# Patient Record
Sex: Male | Born: 2005 | Race: White | Hispanic: Yes | Marital: Single | Smoking: Never smoker
Health system: Southern US, Community
[De-identification: ages and names within clinical notes are randomized; demographics above are authoritative.]

## PROBLEM LIST (undated history)

## (undated) DIAGNOSIS — H539 Unspecified visual disturbance: Secondary | ICD-10-CM

## (undated) DIAGNOSIS — H1010 Acute atopic conjunctivitis, unspecified eye: Secondary | ICD-10-CM

## (undated) DIAGNOSIS — F909 Attention-deficit hyperactivity disorder, unspecified type: Secondary | ICD-10-CM

## (undated) DIAGNOSIS — L309 Dermatitis, unspecified: Secondary | ICD-10-CM

## (undated) DIAGNOSIS — H547 Unspecified visual loss: Secondary | ICD-10-CM

## (undated) HISTORY — DX: Attention-deficit hyperactivity disorder, unspecified type: F90.9

## (undated) HISTORY — DX: Unspecified visual loss: H54.7

## (undated) HISTORY — DX: Dermatitis, unspecified: L30.9

## (undated) HISTORY — DX: Unspecified visual disturbance: H53.9

## (undated) HISTORY — DX: Acute atopic conjunctivitis, unspecified eye: H10.10

---
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
## 2024-01-31 ENCOUNTER — Ambulatory Visit: Payer: Self-pay | Admitting: Pediatrics

## 2024-02-14 ENCOUNTER — Ambulatory Visit (INDEPENDENT_AMBULATORY_CARE_PROVIDER_SITE_OTHER): Admitting: Pediatrics

## 2024-02-14 ENCOUNTER — Encounter: Payer: Self-pay | Admitting: Pediatrics

## 2024-02-14 VITALS — BP 114/62 | Ht 68.5 in | Wt 143.8 lb

## 2024-02-14 DIAGNOSIS — Z131 Encounter for screening for diabetes mellitus: Secondary | ICD-10-CM | POA: Diagnosis not present

## 2024-02-14 DIAGNOSIS — R7303 Prediabetes: Secondary | ICD-10-CM

## 2024-02-14 LAB — POCT GLYCOSYLATED HEMOGLOBIN (HGB A1C): Hemoglobin A1C: 5.7 % — AB (ref 4.0–5.6)

## 2024-02-14 NOTE — Progress Notes (Signed)
  Subjective:    Jermaine Cobb is a 18 y.o. old male here  for Follow-up (Pre- diabetes, Nail concern (Left pointing finger)) .    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  Endocrine: Negative for polydipsia, polyphagia and polyuria.       Objective:    BP 114/62 (BP Location: Left Arm, Patient Position: Sitting, Cuff Size: Normal)   Ht 5' 8.5" (1.74 m)   Wt 143 lb 12.8 oz (65.2 kg)   BMI 21.54 kg/m  Physical Exam Constitutional:      Appearance: Normal appearance.  Cardiovascular:     Rate and Rhythm: Normal rate and regular rhythm.  Pulmonary:     Effort: Pulmonary effort is normal.     Breath sounds: Normal breath sounds.  Abdominal:     Palpations: Abdomen is soft.  Neurological:     Mental Status: He is alert.        Assessment and Plan:     Jermaine Cobb was seen today for Follow-up (Pre- diabetes, Nail concern (Left pointing finger)) .   Problem List Items Addressed This Visit   None Visit Diagnoses       Pre-diabetes    -  Primary     Screening for diabetes mellitus       Relevant Orders   POCT glycosylated hemoglobin (Hb A1C) (Completed)       H/o elevated hgb A1C - down almost into normal range just will dietary changes/decresaing soda. Congratulated on positive changes made.  Reivewed diet - avoid added sugars. Encourage physical activity.   Will plan to follow up at next PE - can schedule here or transition to adult medicine - options discussed.   No follow-ups on file.  Alvena [facility], MD

## 2024-05-25 ENCOUNTER — Encounter (HOSPITAL_COMMUNITY): Payer: Self-pay

## 2024-05-25 ENCOUNTER — Other Ambulatory Visit: Payer: Self-pay

## 2024-05-25 ENCOUNTER — Emergency Department (HOSPITAL_COMMUNITY)
Admission: EM | Admit: 2024-05-25 | Discharge: 2024-05-26 | Discharge status: Against Medical Advice | Attending: Emergency Medicine | Admitting: Emergency Medicine

## 2024-05-25 DIAGNOSIS — R5383 Other fatigue: Secondary | ICD-10-CM | POA: Diagnosis not present

## 2024-05-25 DIAGNOSIS — Z5321 Procedure and treatment not carried out due to patient leaving prior to being seen by health care provider: Secondary | ICD-10-CM | POA: Diagnosis not present

## 2024-05-25 DIAGNOSIS — R220 Localized swelling, mass and lump, head: Secondary | ICD-10-CM | POA: Insufficient documentation

## 2024-05-25 NOTE — ED Notes (Signed)
No answer when called for room 

## 2024-05-25 NOTE — ED Triage Notes (Signed)
 PT states he has been feeling fatigue for 3 days but is starting to feel better. PT also states he has new bumps forming on the back of his head in the left occipital region.

## 2024-05-31 ENCOUNTER — Encounter (HOSPITAL_COMMUNITY): Payer: Self-pay

## 2024-05-31 ENCOUNTER — Ambulatory Visit (HOSPITAL_COMMUNITY)
Admission: EM | Admit: 2024-05-31 | Discharge: 2024-05-31 | Disposition: A | Discharge status: Home / Self Care | Attending: Nurse Practitioner | Admitting: Nurse Practitioner

## 2024-05-31 ENCOUNTER — Ambulatory Visit

## 2024-05-31 DIAGNOSIS — R5383 Other fatigue: Secondary | ICD-10-CM | POA: Diagnosis not present

## 2024-05-31 DIAGNOSIS — R21 Rash and other nonspecific skin eruption: Secondary | ICD-10-CM | POA: Diagnosis not present

## 2024-05-31 DIAGNOSIS — B349 Viral infection, unspecified: Secondary | ICD-10-CM | POA: Diagnosis not present

## 2024-05-31 LAB — POCT RAPID STREP A (OFFICE): Rapid Strep A Screen: NEGATIVE

## 2024-05-31 LAB — POCT MONO SCREEN (KUC): Mono, POC: NEGATIVE

## 2024-05-31 MED ORDER — TRIAMCINOLONE ACETONIDE 0.1 % EX CREA: 1.0000 | TOPICAL_CREAM | Freq: Two times a day (BID) | CUTANEOUS | 0 refills | Status: AC

## 2024-05-31 NOTE — Discharge Instructions (Signed)
 You were seen today for fever, fatigue, sore throat, headache, and neck discomfort. On your exam, there was some mild redness behind both ears, with a slightly larger area that extended to the left side of your neck. There was no swelling, rash, or warmth to suggest infection. Your rapid strep test and mono test were both negative. A throat culture has been sent for confirmation. Your symptoms appear most consistent with a viral illness, and the skin irritation may be related to your recent perm.  You have been prescribed triamcinolone  cream to apply to the red areas twice a day for up to two weeks. For symptom relief, drink plenty of fluids, get extra rest, and use acetaminophen (Tylenol) or ibuprofen  (Motrin /Advil ) to help with fever, sore throat, or body aches. Lozenges, warm teas with honey, and saltwater gargles may also help with throat discomfort.  Please follow up with your primary care provider within the next week for reassessment.You will only be notified if your culture is positive. Otherwise, review results with your primary care provider or view on your MyChart.   Go to the emergency department immediately if you develop severe or worsening sore throat, difficulty breathing or swallowing, high fever that does not improve with medication, spreading rash, swelling of the face or neck, or any other concerning or rapidly worsening symptoms.

## 2024-05-31 NOTE — ED Triage Notes (Addendum)
 Onset 2 weeks ago with fevers, fatigue, headaches with bumps on the posterior left of the head and neck. Denies any new foods, meds, or products. Denies any known sick exposure. Patient took tylenol and otc cold relief which helped the fever and pains.   Sore throat onset 2 days ago.   States the bumps on the back of the head and neck are painful to touch and with turning his head.

## 2024-05-31 NOTE — ED Provider Notes (Signed)
 [facility]    CSN: [id number] Arrival date & time: 05/31/24  9047      History   Chief Complaint Chief Complaint  Patient presents with   Fever   Rash    HPI Jermaine Cobb is a 18 y.o. male.   Discussed the use of AI scribe software for clinical note transcription with the patient, who gave verbal consent to proceed.   The patient presents with a 2-week history of feeling unwell, characterized by low-grade fever, fatigue, and not feeling normal. The patient's primary concern is the appearance of small bumps on both sides of the neck and along the hairline, which have persisted for at least 2 weeks.  The patient reports experiencing fatigue, particularly after work, which has impacted their ability to engage in activities. Yesterday, they had to sleep instead of going out as planned. The patient describes their fever as pretty high today, causing them to miss work. The patient also complains of a sore throat but denies any cough, runny nose, sneezing, or nasal congestion.  The neck bumps are described as little bumps located on both sides of the neck and along the hairline. The patient notes that these bumps sometimes cause headaches, especially when pressed or touched. The patient had initially waited to see if the bumps would resolve on their own but became concerned when they persisted.  The patient mentions getting a perm about 2 weeks before the onset of symptoms, though they are unsure if this is related to their current condition. He denies using any new soaps, lotions, detergents, hair products, colognes, or sprays. The patient also denies any known insect bites or outdoor exposures that could have contributed to their symptoms.  To manage symptoms, the patient has taken tylenol and motrin , which he reports helped a little. He confirms maintaining adequate hydration throughout their illness.  The following portions of the patient's history were  reviewed and updated as appropriate: allergies, current medications, past family history, past medical history, past social history, past surgical history, and problem list.    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 No pertinent surgical history.     Home Medications    Prior to Admission medications   Not on File    Family History Family History  Problem Relation Age of Onset   Hypertension Father     Social History Social History   Tobacco Use   Smoking status: Never   Smokeless tobacco: Never  Vaping Use   Vaping status: Never Used  Substance Use Topics   Alcohol use: Never   Drug use: Yes    Types: Marijuana     Allergies   Patient has no known allergies.   Review of Systems Review of Systems  Constitutional:  Positive for fatigue and fever. Negative for chills.  HENT:  Positive for sore throat. Negative for congestion, rhinorrhea and sneezing.   Respiratory:  Negative for cough.   Gastrointestinal:  Negative for diarrhea, nausea and vomiting.  Musculoskeletal:  Positive for neck pain. Negative for myalgias and neck stiffness.  Skin:  Positive for rash.  Neurological:  Positive for headaches.  All other systems reviewed and are negative.    Physical Exam Triage Vital Signs ED Triage Vitals  Encounter Vitals Group     BP 05/31/24 1021 110/72     Girls Systolic BP Percentile --      Girls Diastolic BP Percentile --      Boys Systolic BP Percentile --      Boys Diastolic BP Percentile --      Pulse Rate 05/31/24 1021 68     Resp 05/31/24  1021 16     Temp 05/31/24 1021 98.5 F (36.9 C)     Temp Source 05/31/24 1021 Oral     SpO2 05/31/24 1021 98 %     Weight 05/31/24 1017 150 lb (68 kg)     Height 05/31/24 1017 5' 10 (1.778 m)     Head Circumference --      Peak Flow --      Pain Score 05/31/24 1017 8     Pain Loc --      Pain Education --      Exclude from Growth Chart --    No data found.  Updated Vital Signs BP 110/72 (BP Location: Right Arm)   Pulse 68   Temp 98.5 F (36.9 C) (Oral)   Resp 16   Ht 5' 10 (1.778 m)   Wt 150 lb (68 kg)   SpO2 98%   BMI 21.52 kg/m   Visual Acuity Right Eye Distance:   Left Eye Distance:   Bilateral Distance:    Right Eye Near:   Left Eye Near:    Bilateral Near:     Physical Exam Vitals reviewed.  Constitutional:      General: He is not in acute distress.    Appearance: Normal appearance. He is not ill-appearing, toxic-appearing or diaphoretic.  HENT:     Head: Normocephalic.     Right Ear: Tympanic membrane, ear canal and external ear normal.     Left Ear: Tympanic membrane, ear canal and external ear normal.     Nose: Nose normal.     Mouth/Throat:     Mouth: Mucous membranes are moist.     Pharynx: Uvula midline. Pharyngeal swelling and posterior oropharyngeal erythema present. No oropharyngeal exudate or uvula swelling.  Eyes:     Conjunctiva/sclera: Conjunctivae normal.  Neck:     Trachea: Phonation normal.  Cardiovascular:     Rate and Rhythm: Normal rate and regular rhythm.  Pulmonary:     Effort: Pulmonary effort is normal.     Breath sounds: Normal breath sounds.  Musculoskeletal:        General: Normal range of motion.     Cervical back: Normal range of motion and neck supple. No rigidity.  Lymphadenopathy:     Cervical: Cervical adenopathy present.     Right cervical: Posterior cervical adenopathy present.  Skin:    General: Skin is warm and dry.     Comments: Mild erythema observed behind the left ear at the hairline, extending to the  nape of the neck, without visible or palpable rash, swelling, or increased warmth. A smaller area of mild erythema is present behind the right ear, with no extension, rash, swelling, or warmth noted.  Neurological:     General: No focal deficit present.     Mental Status: He is alert and oriented to person, place, and time.      UC Treatments / Results  Labs (all labs ordered are listed, but only abnormal results are displayed) Labs Reviewed  POCT MONO SCREEN [facility])  POCT RAPID STREP A (OFFICE)    EKG   Radiology No results found.  Procedures Procedures (including critical care time)  Medications Ordered in UC Medications - No data to display  Initial Impression / Assessment and Plan / UC Course  I have reviewed the triage vital signs and the nursing notes.  Pertinent labs & imaging results that were available during my care of the patient were reviewed by me and considered in my medical decision making (see chart for details).       The patient presents with a two-week history of subjective fevers and fatigue, with associated sore throat, headache, and neck pain. He also reports concern for a rash on the neck. Examination revealed mild erythema behind both ears, extending to the nape of the neck on the left side, without rash, swelling, or warmth. Findings are most consistent with irritation or reaction related to a recent perm, along with symptoms of a likely viral illness. Rapid strep testing was negative, and a culture has been sent for confirmation. Triamcinolone  cream was prescribed to apply to affected areas twice daily for up to two weeks. Supportive care including hydration, rest, and over-the-counter acetaminophen or ibuprofen  for fever and discomfort was advised. The patient was instructed to follow up with his primary care provider for reassessment and to seek emergency care if he develops worsening fever, difficulty swallowing, shortness of breath, rash progression,  or other concerning symptoms.  Today's evaluation has revealed no signs of a dangerous process. Discussed diagnosis with patient and/or guardian. Patient and/or guardian aware of their diagnosis, possible red flag symptoms to watch out for and need for close follow up. Patient and/or guardian understands verbal and written discharge instructions. Patient and/or guardian comfortable with plan and disposition.  Patient and/or guardian has a clear mental status at this time, good insight into illness (after discussion and teaching) and has clear judgment to make decisions regarding their care  Documentation was completed with the aid of voice recognition software. Transcription may contain typographical errors.  Final Clinical Impressions(s) / UC Diagnoses   Final diagnoses:  Fatigue, unspecified type  Viral illness  Rash and nonspecific skin eruption     Discharge Instructions      You were seen today for fever, fatigue, sore throat, headache, and neck discomfort. On your exam, there was some mild redness behind both ears, with a slightly larger area that extended to the left side of your neck. There was no swelling, rash, or warmth to suggest infection. Your rapid strep test and mono test were both negative. A throat culture has been sent for confirmation. Your symptoms appear most consistent with a viral illness, and the skin irritation may be related to your recent perm.  You have been prescribed triamcinolone  cream to apply to the red areas twice a day for up to two weeks. For symptom relief, drink plenty of fluids, get extra rest, and use acetaminophen (Tylenol) or ibuprofen  (Motrin /Advil ) to help with fever, sore throat, or body aches. Lozenges, warm teas with honey, and saltwater gargles may also help with throat discomfort.  Please follow up with your primary care provider within the next week for reassessment.You will only be notified if your culture is positive. Otherwise, review  results with your primary care provider or view on your MyChart.   Go to the emergency department immediately if you develop severe or  worsening sore throat, difficulty breathing or swallowing, high fever that does not improve with medication, spreading rash, swelling of the face or neck, or any other concerning or rapidly worsening symptoms.     ED Prescriptions   None    PDMP not reviewed this encounter.   Iola Lukes, [place] 05/31/24 1114

## 2024-06-01 ENCOUNTER — Emergency Department (HOSPITAL_COMMUNITY)
Admission: EM | Admit: 2024-06-01 | Discharge: 2024-06-01 | Disposition: A | Discharge status: Home / Self Care | Attending: Emergency Medicine | Admitting: Emergency Medicine

## 2024-06-01 ENCOUNTER — Encounter (HOSPITAL_COMMUNITY): Payer: Self-pay

## 2024-06-01 ENCOUNTER — Emergency Department (HOSPITAL_COMMUNITY)

## 2024-06-01 ENCOUNTER — Other Ambulatory Visit: Payer: Self-pay

## 2024-06-01 DIAGNOSIS — R221 Localized swelling, mass and lump, neck: Secondary | ICD-10-CM | POA: Diagnosis not present

## 2024-06-01 DIAGNOSIS — J029 Acute pharyngitis, unspecified: Secondary | ICD-10-CM | POA: Diagnosis not present

## 2024-06-01 LAB — I-STAT CHEM 8, ED
BUN: 11 mg/dL (ref 6–20)
Calcium, Ion: 1.14 mmol/L — ABNORMAL LOW (ref 1.15–1.40)
Chloride: 100 mmol/L (ref 98–111)
Creatinine, Ser: 1.1 mg/dL (ref 0.61–1.24)
Glucose, Bld: 132 mg/dL — ABNORMAL HIGH (ref 70–99)
HCT: 45 % (ref 39.0–52.0)
Hemoglobin: 15.3 g/dL (ref 13.0–17.0)
Potassium: 4 mmol/L (ref 3.5–5.1)
Sodium: 138 mmol/L (ref 135–145)
TCO2: 26 mmol/L (ref 22–32)

## 2024-06-01 LAB — CBC WITH DIFFERENTIAL/PLATELET
Basophils Absolute: 0.1 K/uL (ref 0.0–0.1)
Basophils Relative: 1 %
Eosinophils Absolute: 0.2 K/uL (ref 0.0–0.5)
Eosinophils Relative: 2 %
HCT: 43.4 % (ref 39.0–52.0)
Hemoglobin: 13.5 g/dL (ref 13.0–17.0)
Lymphocytes Relative: 41 %
Lymphs Abs: 4 K/uL (ref 0.7–4.0)
MCH: 22.1 pg — ABNORMAL LOW (ref 26.0–34.0)
MCHC: 31.1 g/dL (ref 30.0–36.0)
MCV: 70.9 fL — ABNORMAL LOW (ref 80.0–100.0)
Monocytes Absolute: 0.8 K/uL (ref 0.1–1.0)
Monocytes Relative: 8 %
Neutro Abs: 4.7 K/uL (ref 1.7–7.7)
Neutrophils Relative %: 48 %
Platelets: 257 K/uL (ref 150–400)
RBC: 6.12 MIL/uL — ABNORMAL HIGH (ref 4.22–5.81)
RDW: 15.2 % (ref 11.5–15.5)
WBC: 9.8 K/uL (ref 4.0–10.5)
nRBC: 0 % (ref 0.0–0.2)

## 2024-06-01 LAB — RESP PANEL BY RT-PCR (RSV, FLU A&B, COVID)  RVPGX2
Influenza A by PCR: NEGATIVE
Influenza B by PCR: NEGATIVE
Resp Syncytial Virus by PCR: NEGATIVE
SARS Coronavirus 2 by RT PCR: NEGATIVE

## 2024-06-01 LAB — GROUP A STREP BY PCR: Group A Strep by PCR: NOT DETECTED

## 2024-06-01 MED ORDER — IOHEXOL 350 MG/ML SOLN
75.0000 mL | Freq: Once | INTRAVENOUS | Status: AC | PRN
  Administered 2024-06-01: 75 mL via INTRAVENOUS

## 2024-06-01 MED ORDER — DEXAMETHASONE SODIUM PHOSPHATE 10 MG/ML IJ SOLN
6.0000 mg | Freq: Once | INTRAMUSCULAR | Status: AC
  Administered 2024-06-01: 6 mg via INTRAVENOUS
  Filled 2024-06-01: qty 1

## 2024-06-01 MED ORDER — AMOXICILLIN-POT CLAVULANATE 875-125 MG PO TABS: 1.0000 | ORAL_TABLET | Freq: Two times a day (BID) | ORAL | 0 refills | Status: AC

## 2024-06-01 MED ORDER — VANCOMYCIN HCL IN DEXTROSE 1-5 GM/200ML-% IV SOLN
1000.0000 mg | Freq: Once | INTRAVENOUS | Status: DC
  Filled 2024-06-01: qty 200

## 2024-06-01 MED ORDER — SODIUM CHLORIDE 0.9 % IV SOLN
2.0000 g | Freq: Once | INTRAVENOUS | Status: AC
  Administered 2024-06-01: 2 g via INTRAVENOUS
  Filled 2024-06-01: qty 20

## 2024-06-01 MED ORDER — LIDOCAINE VISCOUS HCL 2 % MT SOLN
15.0000 mL | Freq: Once | OROMUCOSAL | Status: AC
  Administered 2024-06-01: 15 mL via OROMUCOSAL
  Filled 2024-06-01: qty 15

## 2024-06-01 MED ORDER — DEXAMETHASONE SODIUM PHOSPHATE 4 MG/ML IJ SOLN
4.0000 mg | Freq: Once | INTRAMUSCULAR | Status: AC
  Administered 2024-06-01: 4 mg via INTRAMUSCULAR
  Filled 2024-06-01: qty 1

## 2024-06-01 MED ORDER — AMOXICILLIN-POT CLAVULANATE 875-125 MG PO TABS
1.0000 | ORAL_TABLET | Freq: Once | ORAL | Status: AC
  Administered 2024-06-01: 1 via ORAL
  Filled 2024-06-01: qty 1

## 2024-06-01 MED ORDER — CEFTRIAXONE SODIUM 1 G IJ SOLR: 2.0000 g | Freq: Once | INTRAMUSCULAR | Status: DC

## 2024-06-01 NOTE — ED Provider Notes (Signed)
 [place] EMERGENCY DEPARTMENT AT [facility] Provider Note   CSN: [id number] Arrival date & time: 06/01/24  0133     Patient presents with: Sore Throat   Jermaine Cobb is a 18 y.o. male.   The history is provided by the patient.  Sore Throat This is a new problem. The current episode started more than 1 week ago. The problem occurs constantly. The problem has been gradually worsening. Pertinent negatives include no chest pain, no abdominal pain and no headaches. Nothing aggravates the symptoms. Nothing relieves the symptoms. He has tried nothing for the symptoms. The treatment provided no relief.  Patient seen at urgent care for fatigue and sore throat was seen at urgent care earlier in the day and diagnosed with viral illness. Feels swelling in the neck and pain with swallowing.      Past Medical History:  Diagnosis Date   ADHD (attention deficit hyperactivity disorder)    Allergic conjunctivitis 03/22/2014   Eczema    Vision abnormalities    Vision problems 03/22/2014     Prior to Admission medications   Medication Sig Start Date End Date Taking? Authorizing Provider  triamcinolone  cream (KENALOG ) 0.1 % Apply 1 Application topically 2 (two) times daily. Apply to affected areas two times a day until symptoms resolve for up to 2 weeks 05/31/24   Iola Lukes, FNP    Allergies: Patient has no known allergies.    Review of Systems  HENT:  Positive for sore throat. Negative for facial swelling and voice change.   Respiratory:  Negative for wheezing and stridor.   Cardiovascular:  Negative for chest pain.  Gastrointestinal:  Negative for abdominal pain.  Neurological:  Negative for headaches.  All other systems reviewed and are negative.   Updated Vital Signs BP 111/71   Pulse 75   Temp 98.1 F (36.7 C)   Resp 18   Ht 5' 10 (1.778 m)   Wt 66.2 kg   SpO2 100%   BMI 20.95 kg/m   Physical Exam Vitals and nursing note reviewed.  Constitutional:       General: He is not in acute distress.    Appearance: He is well-developed. He is not diaphoretic.  HENT:     Head: Normocephalic and atraumatic.     Nose: Nose normal.     Mouth/Throat:     Mouth: Mucous membranes are moist.     Pharynx: Oropharyngeal exudate present.     Comments: Enlarged symmetric tonsils with exudate  Eyes:     Conjunctiva/sclera: Conjunctivae normal.     Pupils: Pupils are equal, round, and reactive to light.  Neck:     Vascular: No carotid bruit.  Cardiovascular:     Rate and Rhythm: Normal rate and regular rhythm.     Pulses: Normal pulses.     Heart sounds: Normal heart sounds.  Pulmonary:     Effort: Pulmonary effort is normal.     Breath sounds: Normal breath sounds. No stridor. No wheezing or rales.  Abdominal:     General: Bowel sounds are normal.     Palpations: Abdomen is soft.     Tenderness: There is no abdominal tenderness. There is no guarding or rebound.  Musculoskeletal:        General: Normal range of motion.     Cervical back: Normal range of motion and neck supple. No rigidity or tenderness.  Lymphadenopathy:     Cervical: Cervical adenopathy present.  Skin:    General: Skin  is warm and dry.     Capillary Refill: Capillary refill takes less than 2 seconds.  Neurological:     General: No focal deficit present.     Mental Status: He is alert and oriented to person, place, and time.     Deep Tendon Reflexes: Reflexes normal.  Psychiatric:        Mood and Affect: Mood normal.     (all labs ordered are listed, but only abnormal results are displayed) Results for orders placed or performed during the hospital encounter of 06/01/24  Group A Strep by PCR if patient complains of sore throat.   Collection Time: 06/01/24  1:41 AM   Specimen: Throat; Sterile Swab  Result Value Ref Range   Group A Strep by PCR NOT DETECTED NOT DETECTED  Resp panel by RT-PCR (RSV, Flu A&B, Covid) Throat   Collection Time: 06/01/24  1:41 AM   Specimen:  Throat; Nasal Swab  Result Value Ref Range   SARS Coronavirus 2 by RT PCR NEGATIVE NEGATIVE   Influenza A by PCR NEGATIVE NEGATIVE   Influenza B by PCR NEGATIVE NEGATIVE   Resp Syncytial Virus by PCR NEGATIVE NEGATIVE  CBC with Differential   Collection Time: 06/01/24  6:24 AM  Result Value Ref Range   WBC 9.8 4.0 - 10.5 K/uL   RBC 6.12 (H) 4.22 - 5.81 MIL/uL   Hemoglobin 13.5 13.0 - 17.0 g/dL   HCT 56.5 60.9 - 47.9 %   MCV 70.9 (L) 80.0 - 100.0 fL   MCH 22.1 (L) 26.0 - 34.0 pg   MCHC 31.1 30.0 - 36.0 g/dL   RDW 84.7 88.4 - 84.4 %   Platelets 257 150 - 400 K/uL   nRBC 0.0 0.0 - 0.2 %   Neutrophils Relative % PENDING %   Neutro Abs PENDING 1.7 - 7.7 K/uL   Band Neutrophils PENDING %   Lymphocytes Relative PENDING %   Lymphs Abs PENDING 0.7 - 4.0 K/uL   Monocytes Relative PENDING %   Monocytes Absolute PENDING 0.1 - 1.0 K/uL   Eosinophils Relative PENDING %   Eosinophils Absolute PENDING 0.0 - 0.5 K/uL   Basophils Relative PENDING %   Basophils Absolute PENDING 0.0 - 0.1 K/uL   WBC Morphology PENDING    RBC Morphology PENDING    Smear Review PENDING    Other PENDING %   nRBC PENDING 0 /100 WBC   Metamyelocytes Relative PENDING %   Myelocytes PENDING %   Promyelocytes Relative PENDING %   Blasts PENDING %   Immature Granulocytes PENDING %   Abs Immature Granulocytes PENDING 0.00 - 0.07 K/uL  I-stat chem 8, ED (not at [facility], [facility] or [facility])   Collection Time: 06/01/24  6:28 AM  Result Value Ref Range   Sodium 138 135 - 145 mmol/L   Potassium 4.0 3.5 - 5.1 mmol/L   Chloride 100 98 - 111 mmol/L   BUN 11 6 - 20 mg/dL   Creatinine, Ser 8.89 0.61 - 1.24 mg/dL   Glucose, Bld 867 (H) 70 - 99 mg/dL   Calcium, Ion 8.85 (L) 1.15 - 1.40 mmol/L   TCO2 26 22 - 32 mmol/L   Hemoglobin 15.3 13.0 - 17.0 g/dL   HCT 54.9 60.9 - 47.9 %   DG Neck Soft Tissue Result Date: 06/01/2024 CLINICAL DATA:  Sore throat pain. EXAM: NECK SOFT TISSUES - 1+ VIEW COMPARISON:  None Available. FINDINGS: The  precervical soft tissue stripe is normal in thickness. No enlargement is  seen in the adenoids nor at the level of the palatine tonsils. There is slight fullness of the epiglottis which could be due to positioning on the lateral view not imaging the epiglottis en face or early epiglottitis. The aryepiglottic folds and arytenoid eminence as do not appear thickened. IMPRESSION: 1. Slight fullness of the epiglottis which could be due to positioning on the lateral view not imaging the epiglottis en face, or early epiglottitis. CT may be helpful. 2. No enlargement of the adenoids or palatine tonsils. Electronically Signed   By: Francis Quam M.D.   On: 06/01/2024 06:09     EKG: None  Radiology: DG Neck Soft Tissue Result Date: 06/01/2024 CLINICAL DATA:  Sore throat pain. EXAM: NECK SOFT TISSUES - 1+ VIEW COMPARISON:  None Available. FINDINGS: The precervical soft tissue stripe is normal in thickness. No enlargement is seen in the adenoids nor at the level of the palatine tonsils. There is slight fullness of the epiglottis which could be due to positioning on the lateral view not imaging the epiglottis en face or early epiglottitis. The aryepiglottic folds and arytenoid eminence as do not appear thickened. IMPRESSION: 1. Slight fullness of the epiglottis which could be due to positioning on the lateral view not imaging the epiglottis en face, or early epiglottitis. CT may be helpful. 2. No enlargement of the adenoids or palatine tonsils. Electronically Signed   By: Francis Quam M.D.   On: 06/01/2024 06:09     Procedures   Medications Ordered in the ED  dexamethasone  (DECADRON ) injection 4 mg (4 mg Intramuscular Given 06/01/24 0547)  lidocaine  (XYLOCAINE ) 2 % viscous mouth solution 15 mL (15 mLs Mouth/Throat Given 06/01/24 0544)  amoxicillin -clavulanate (AUGMENTIN ) 875-125 MG per tablet 1 tablet (1 tablet Oral Given 06/01/24 0544)  dexamethasone  (DECADRON ) injection 6 mg (6 mg Intravenous Given 06/01/24 9372)                                     Medical Decision Making Amount and/or Complexity of Data Reviewed External Data Reviewed: notes.    Details: Previous notes reviewed  Labs: ordered.    Details: Negative strep negative covid and flu. Normal white count 9.8 normal hemoglobin 13.5, normal platelets.  Normal sodium 138, normal potassium 4, normal creatinine  Radiology: ordered.    Details: No obstruction in neck on XR, CT ordered   Risk Prescription drug management.      Final diagnoses:  None   Signed out to Dr. Yolande pending CT ED Discharge Orders     None          Keasha Malkiewicz, MD 06/01/24 [phone]

## 2024-06-01 NOTE — Discharge Instructions (Signed)
 You were seen for your sore throat and fatigue in the emergency department.   At home, please take Tylenol and ibuprofen  for your pain.  Take the antibiotics (amoxicillin ) until your monotest comes back.  If the monotest is negative it means that it is likely a bacterial pharyngitis so continue the antibiotics.    If the monotest is positive you may stop the antibiotics.  Refrain from any sports or activities where he could sustain abdominal trauma because your spleen can get enlarged.  Check your MyChart online for the results of your monotest  Follow-up with your primary doctor in 2-3 days regarding your visit.    Return immediately to the emergency department if you experience any of the following: Difficulty breathing, or any other concerning symptoms.    Thank you for visiting our Emergency Department. It was a pleasure taking care of you today.

## 2024-06-01 NOTE — ED Provider Notes (Signed)
  Physical Exam  BP 111/71   Pulse 75   Temp 98.1 F (36.7 C)   Resp 18   Ht 5' 10 (1.778 m)   Wt 66.2 kg   SpO2 100%   BMI 20.95 kg/m   Physical Exam  Procedures  Procedures  ED Course / MDM   Clinical Course as of 06/01/24 0758  Fri Jun 01, 2024  9348 Assumed care. 18 yo M who pw sore throat and sob x2d tonsils have exudate but DG neck showed possible epiglottis. Given decadron . No airway concerns now. Given vanc and rocephin . Getting CT.  [RP]  0739 CT shows pharyngitis.  No signs of peritonsillar abscess or epiglottitis.  Patient reevaluated.  No stridor.  Does have posterior lymph node enlargement.  No splenomegaly.  Given his age and the duration of his symptoms I am worried that he might have mononucleosis.  Will send serum testing in case he had a false negative Monospot.  Will have him check his MyChart.  Was counseled if positive to refrain from any contact sports or activities where he could hurt his abdomen.  Given antibiotics for his pharyngitis and instructed to stop taking them if positive mono testing [RP]    Clinical Course User Index [RP] Yolande Lamar BROCKS, MD   Medical Decision Making Amount and/or Complexity of Data Reviewed Labs: ordered. Radiology: ordered.  Risk Prescription drug management.       Yolande Lamar BROCKS, MD 06/01/24 [phone]

## 2024-06-01 NOTE — ED Triage Notes (Signed)
 Pt reports sore throat that is worsening. Pt reports that it is difficult to swallow. Pt managing secretions well. Pt reports fatigue x 3 weeks.

## 2024-06-02 ENCOUNTER — Ambulatory Visit

## 2024-06-02 LAB — EPSTEIN-BARR VIRUS (EBV) ANTIBODY PROFILE
EBV NA IgG: <18 U/mL (ref 0.0–17.9)
EBV VCA IgG: 101 U/mL — ABNORMAL HIGH (ref 0.0–17.9)
EBV VCA IgM: >160 U/mL — ABNORMAL HIGH (ref 0.0–35.9)
